# Patient Record
Sex: Male | Born: 1980 | Race: White | Hispanic: No | Marital: Married | Smoking: Current every day smoker
Health system: Southern US, Community
[De-identification: ages and names within clinical notes are randomized; demographics above are authoritative.]

## PROBLEM LIST (undated history)

## (undated) HISTORY — PX: HERNIA REPAIR: SHX51

---

## 2005-01-19 ENCOUNTER — Emergency Department (HOSPITAL_COMMUNITY): Admission: EM | Admit: 2005-01-19 | Discharge: 2005-01-19 | Payer: Self-pay | Admitting: Emergency Medicine

## 2010-05-16 ENCOUNTER — Emergency Department (HOSPITAL_COMMUNITY): Admission: EM | Admit: 2010-05-16 | Discharge: 2010-05-16 | Payer: Self-pay | Admitting: Emergency Medicine

## 2014-06-09 ENCOUNTER — Ambulatory Visit (INDEPENDENT_AMBULATORY_CARE_PROVIDER_SITE_OTHER): Payer: 59 | Admitting: Family Medicine

## 2014-06-09 ENCOUNTER — Ambulatory Visit (INDEPENDENT_AMBULATORY_CARE_PROVIDER_SITE_OTHER): Payer: 59

## 2014-06-09 VITALS — BP 122/72 | HR 60 | Temp 98.5°F | Resp 14 | Ht 70.0 in | Wt 182.4 lb

## 2014-06-09 DIAGNOSIS — S6000XA Contusion of unspecified finger without damage to nail, initial encounter: Secondary | ICD-10-CM

## 2014-06-09 DIAGNOSIS — M79644 Pain in right finger(s): Secondary | ICD-10-CM

## 2014-06-09 DIAGNOSIS — M79609 Pain in unspecified limb: Secondary | ICD-10-CM

## 2014-06-09 MED ORDER — HYDROCODONE-ACETAMINOPHEN 5-325 MG PO TABS: 1.0000 | ORAL_TABLET | Freq: Four times a day (QID) | ORAL | Status: AC | PRN

## 2014-06-09 NOTE — Patient Instructions (Signed)
Take Aleve 2 twice daily for pain  For more severe pain take the hydrocodone one every 4-6 hours as needed. It will make you drowsy.

## 2014-06-09 NOTE — Progress Notes (Signed)
Subjective: He's been drinking beer last night and dropped a bowling ball on his finger. It is swollen and painful today.  Objective: Tip of right fifth finger he has swollen, very tender. Most of the erythema and tenderness is in the DIP joint.  [facility] reading (PRIMARY) by  Dr. Alwyn RenHopper No fracture.  Tuft a little irregular but cannot define a fracture.  Assessment: Contusion and pain finger tip  Plan: Patient declined a finger protector. Treat with pain medication. Supplies. Return if worse. He is going out of town for the week.

## 2015-05-28 IMAGING — CR DG FINGER LITTLE 2+V*R*
2 series · 2 of 2 positions shown · non-contrast
Comparison: None.

CLINICAL DATA: Pain.  Contusion.

EXAM:
RIGHT LITTLE FINGER 2+V

[PA (1 of 2)]
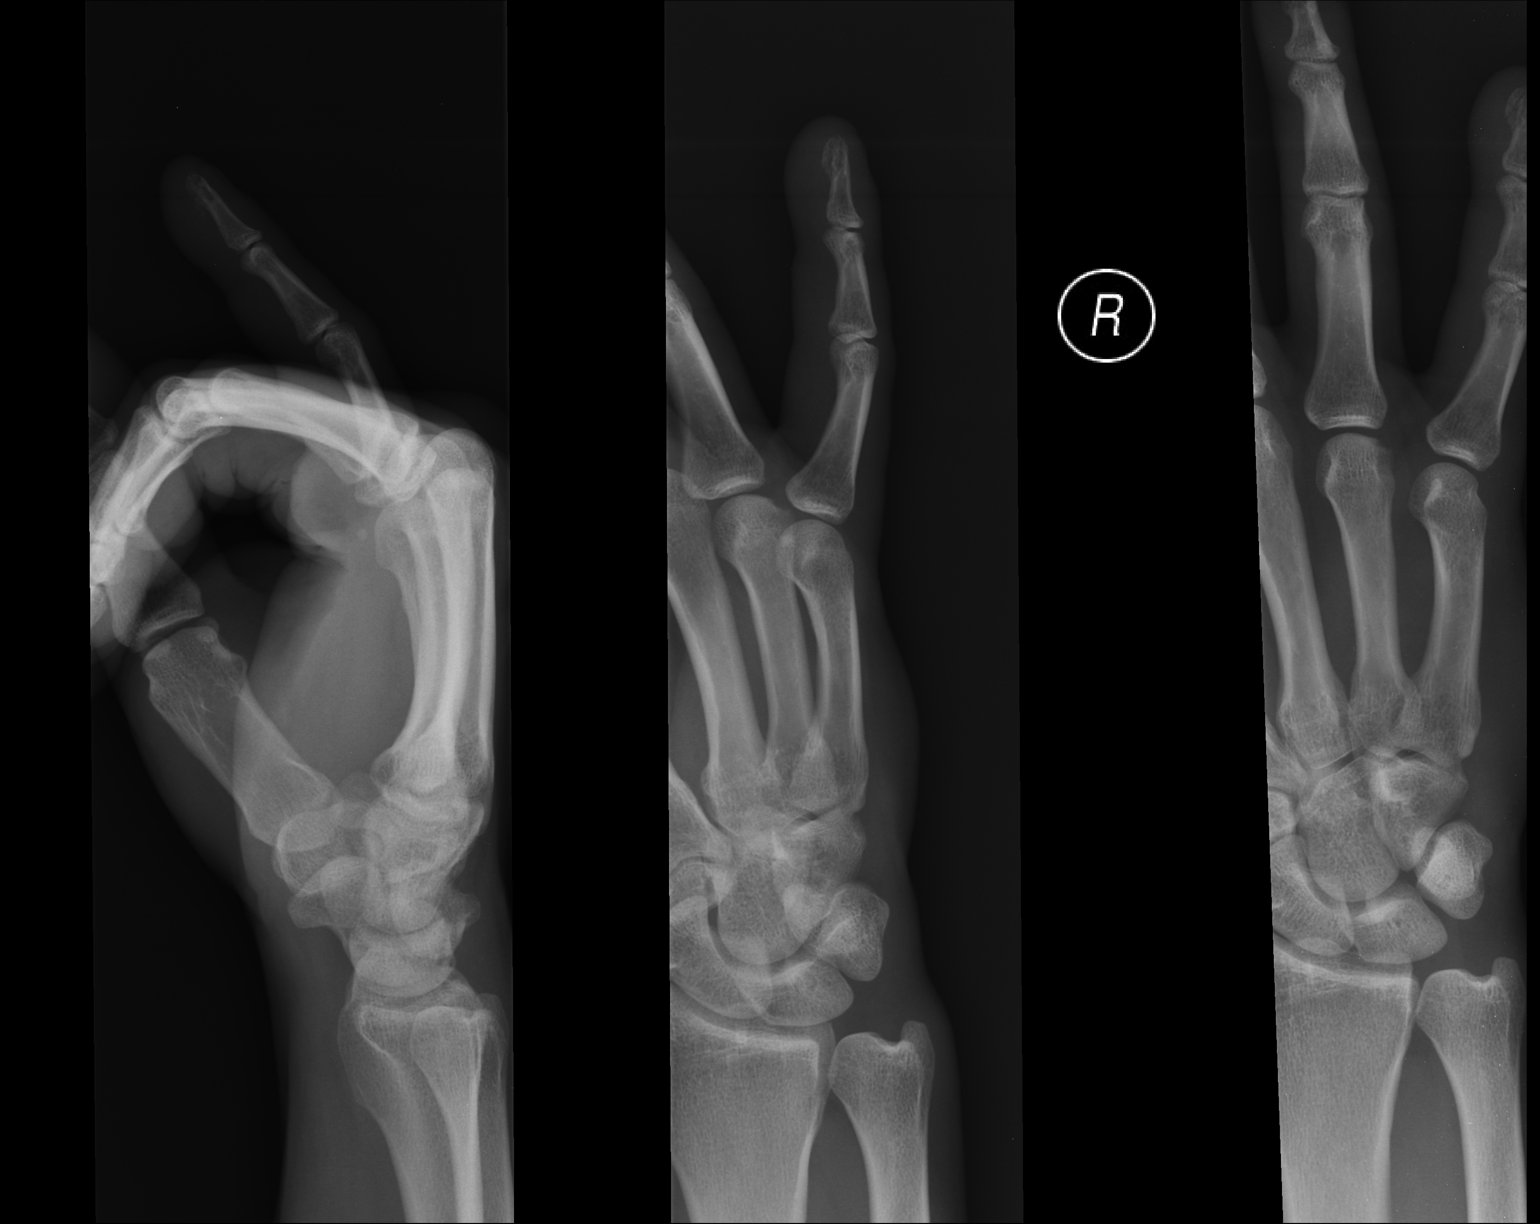

[PA (2 of 2)]
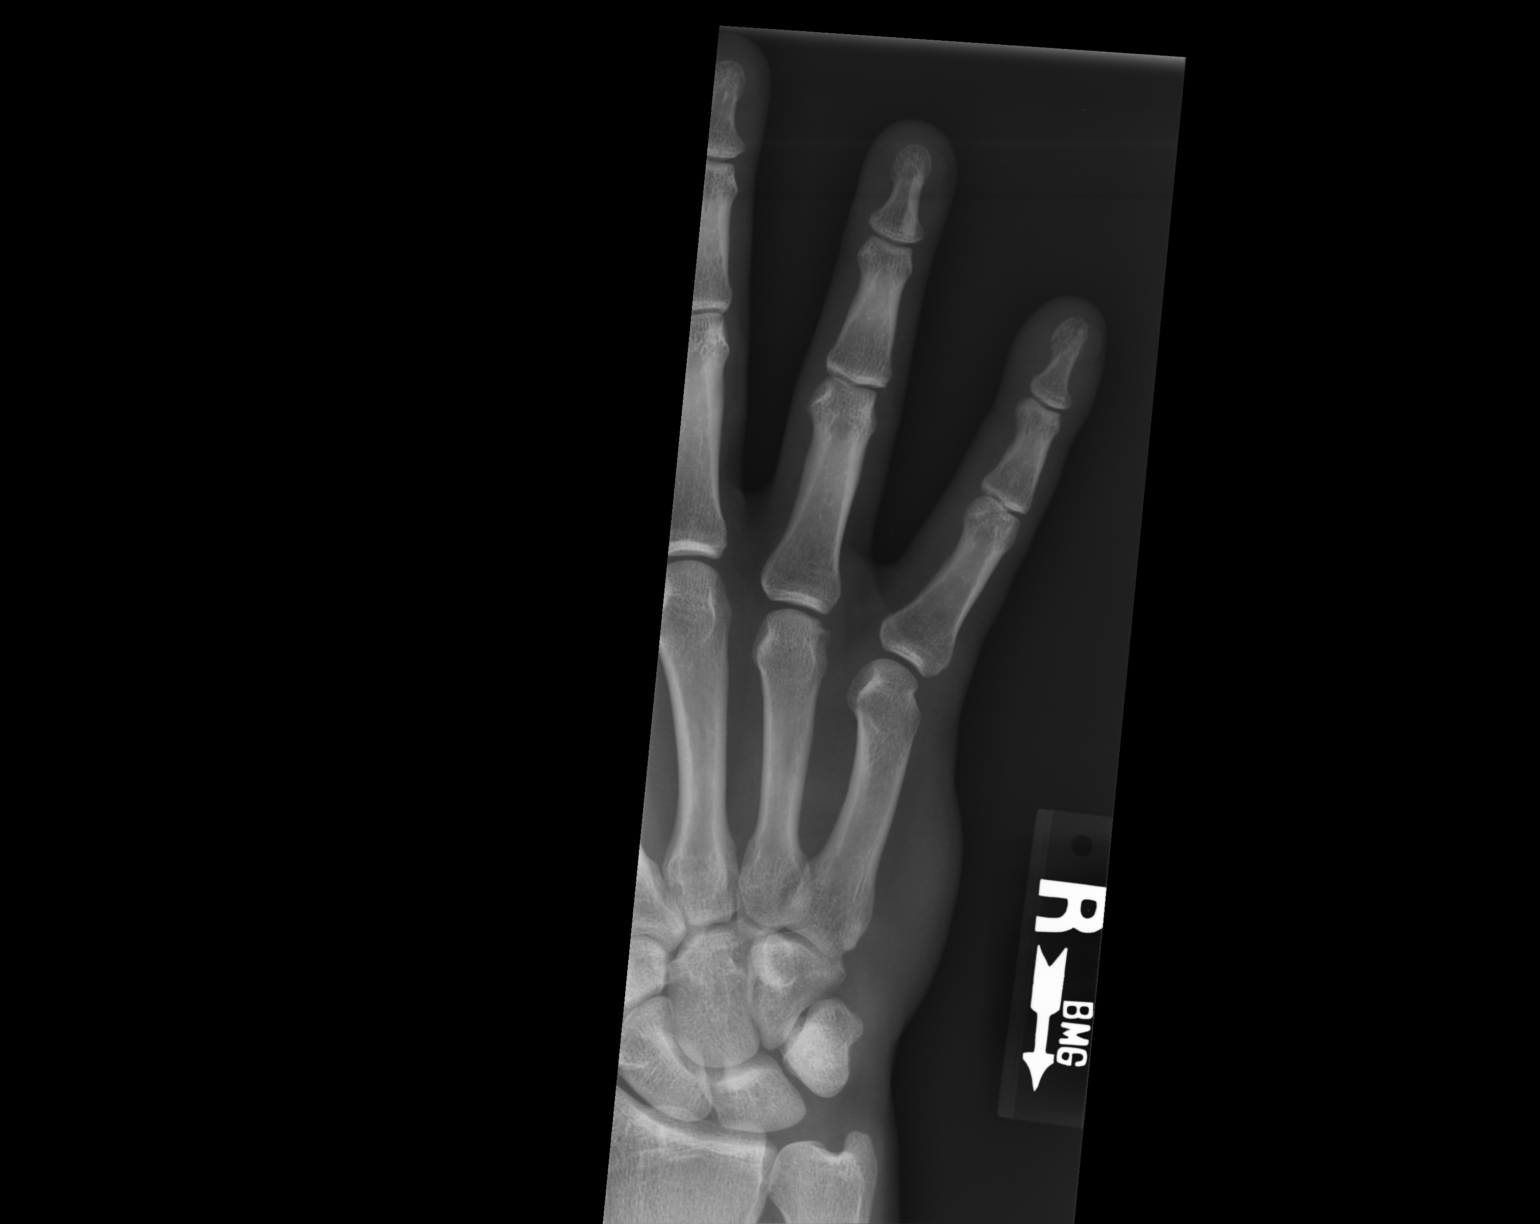

[2 of 2 positions shown; findings below may reference images not displayed]

FINDINGS: Subtle fracture of the distal tuft of the right fifth digit noted.
No other focal abnormality identified. No foreign body.
IMPRESSION: Subtle fracture of the distal tuft of the right fifth digit.

## 2019-10-17 ENCOUNTER — Other Ambulatory Visit: Payer: Self-pay

## 2019-10-17 DIAGNOSIS — Z20822 Contact with and (suspected) exposure to covid-19: Secondary | ICD-10-CM

## 2019-10-18 LAB — NOVEL CORONAVIRUS, NAA: SARS-CoV-2, NAA: NOT DETECTED
